# Patient Record
Sex: Male | Born: 1965 | Race: White | Hispanic: No | Marital: Married | Smoking: Current every day smoker
Health system: Southern US, Community
[De-identification: ages and names within clinical notes are randomized; demographics above are authoritative.]

## PROBLEM LIST (undated history)

## (undated) DIAGNOSIS — I10 Essential (primary) hypertension: Secondary | ICD-10-CM

## (undated) DIAGNOSIS — F32A Depression, unspecified: Secondary | ICD-10-CM

## (undated) DIAGNOSIS — I4891 Unspecified atrial fibrillation: Secondary | ICD-10-CM

---

## 2011-02-09 ENCOUNTER — Emergency Department: Payer: Self-pay | Admitting: *Deleted

## 2019-09-13 ENCOUNTER — Emergency Department: Payer: Self-pay

## 2019-09-13 ENCOUNTER — Other Ambulatory Visit: Payer: Self-pay

## 2019-09-13 ENCOUNTER — Emergency Department
Admission: EM | Admit: 2019-09-13 | Discharge: 2019-09-14 | Disposition: A | Discharge status: Other Acute Inpatient Hospital | Payer: Self-pay | Attending: Emergency Medicine | Admitting: Emergency Medicine

## 2019-09-13 DIAGNOSIS — Z79899 Other long term (current) drug therapy: Secondary | ICD-10-CM | POA: Insufficient documentation

## 2019-09-13 DIAGNOSIS — F1410 Cocaine abuse, uncomplicated: Secondary | ICD-10-CM

## 2019-09-13 DIAGNOSIS — R079 Chest pain, unspecified: Secondary | ICD-10-CM

## 2019-09-13 DIAGNOSIS — F17210 Nicotine dependence, cigarettes, uncomplicated: Secondary | ICD-10-CM | POA: Insufficient documentation

## 2019-09-13 DIAGNOSIS — F315 Bipolar disorder, current episode depressed, severe, with psychotic features: Secondary | ICD-10-CM | POA: Insufficient documentation

## 2019-09-13 DIAGNOSIS — I10 Essential (primary) hypertension: Secondary | ICD-10-CM | POA: Insufficient documentation

## 2019-09-13 DIAGNOSIS — R45851 Suicidal ideations: Secondary | ICD-10-CM

## 2019-09-13 DIAGNOSIS — Z20822 Contact with and (suspected) exposure to covid-19: Secondary | ICD-10-CM | POA: Insufficient documentation

## 2019-09-13 DIAGNOSIS — I4891 Unspecified atrial fibrillation: Secondary | ICD-10-CM | POA: Insufficient documentation

## 2019-09-13 HISTORY — DX: Unspecified atrial fibrillation: I48.91

## 2019-09-13 HISTORY — DX: Essential (primary) hypertension: I10

## 2019-09-13 HISTORY — DX: Depression, unspecified: F32.A

## 2019-09-13 LAB — CBC WITH DIFFERENTIAL/PLATELET
Abs Immature Granulocytes: 0.03 10*3/uL (ref 0.00–0.07)
Basophils Absolute: 0 10*3/uL (ref 0.0–0.1)
Basophils Relative: 1 %
Eosinophils Absolute: 0.1 10*3/uL (ref 0.0–0.5)
Eosinophils Relative: 2 %
HCT: 38 % — ABNORMAL LOW (ref 39.0–52.0)
Hemoglobin: 13.7 g/dL (ref 13.0–17.0)
Immature Granulocytes: 1 %
Lymphocytes Relative: 23 %
Lymphs Abs: 1.4 10*3/uL (ref 0.7–4.0)
MCH: 32.4 pg (ref 26.0–34.0)
MCHC: 36.1 g/dL — ABNORMAL HIGH (ref 30.0–36.0)
MCV: 89.8 fL (ref 80.0–100.0)
Monocytes Absolute: 0.4 10*3/uL (ref 0.1–1.0)
Monocytes Relative: 6 %
Neutro Abs: 4.1 10*3/uL (ref 1.7–7.7)
Neutrophils Relative %: 67 %
Platelets: 184 10*3/uL (ref 150–400)
RBC: 4.23 MIL/uL (ref 4.22–5.81)
RDW: 13.3 % (ref 11.5–15.5)
WBC: 6.1 10*3/uL (ref 4.0–10.5)
nRBC: 0 % (ref 0.0–0.2)

## 2019-09-13 LAB — URINE DRUG SCREEN, QUALITATIVE (ARMC ONLY)
Amphetamines, Ur Screen: NOT DETECTED
Barbiturates, Ur Screen: NOT DETECTED
Benzodiazepine, Ur Scrn: NOT DETECTED
Cannabinoid 50 Ng, Ur ~~LOC~~: NOT DETECTED
Cocaine Metabolite,Ur ~~LOC~~: POSITIVE — AB
MDMA (Ecstasy)Ur Screen: NOT DETECTED
Methadone Scn, Ur: NOT DETECTED
Opiate, Ur Screen: NOT DETECTED
Phencyclidine (PCP) Ur S: NOT DETECTED
Tricyclic, Ur Screen: NOT DETECTED

## 2019-09-13 LAB — BASIC METABOLIC PANEL
Anion gap: 10 (ref 5–15)
BUN: 6 mg/dL (ref 6–20)
CO2: 23 mmol/L (ref 22–32)
Calcium: 8.7 mg/dL — ABNORMAL LOW (ref 8.9–10.3)
Chloride: 107 mmol/L (ref 98–111)
Creatinine, Ser: 0.8 mg/dL (ref 0.61–1.24)
GFR calc Af Amer: >60 mL/min (ref >60)
GFR calc non Af Amer: >60 mL/min (ref >60)
Glucose, Bld: 146 mg/dL — ABNORMAL HIGH (ref 70–99)
Potassium: 3.3 mmol/L — ABNORMAL LOW (ref 3.5–5.1)
Sodium: 140 mmol/L (ref 135–145)

## 2019-09-13 LAB — ETHANOL: Alcohol, Ethyl (B): <10 mg/dL (ref <10)

## 2019-09-13 LAB — TROPONIN I (HIGH SENSITIVITY)
Troponin I (High Sensitivity): 9 ng/L (ref <18)
Troponin I (High Sensitivity): 9 ng/L (ref <18)

## 2019-09-13 LAB — SARS CORONAVIRUS 2 BY RT PCR (HOSPITAL ORDER, PERFORMED IN ~~LOC~~ HOSPITAL LAB): SARS Coronavirus 2: NEGATIVE

## 2019-09-13 IMAGING — DX DG CHEST 1V PORT
1 series · 2 of 2 positions shown · non-contrast
Comparison: None.

CLINICAL DATA: Chest pain

EXAM:
PORTABLE CHEST 1 VIEW

[Series 1: chest ap · 0.14mm/px · 2 of 2 slices shown]
[im 1/2]
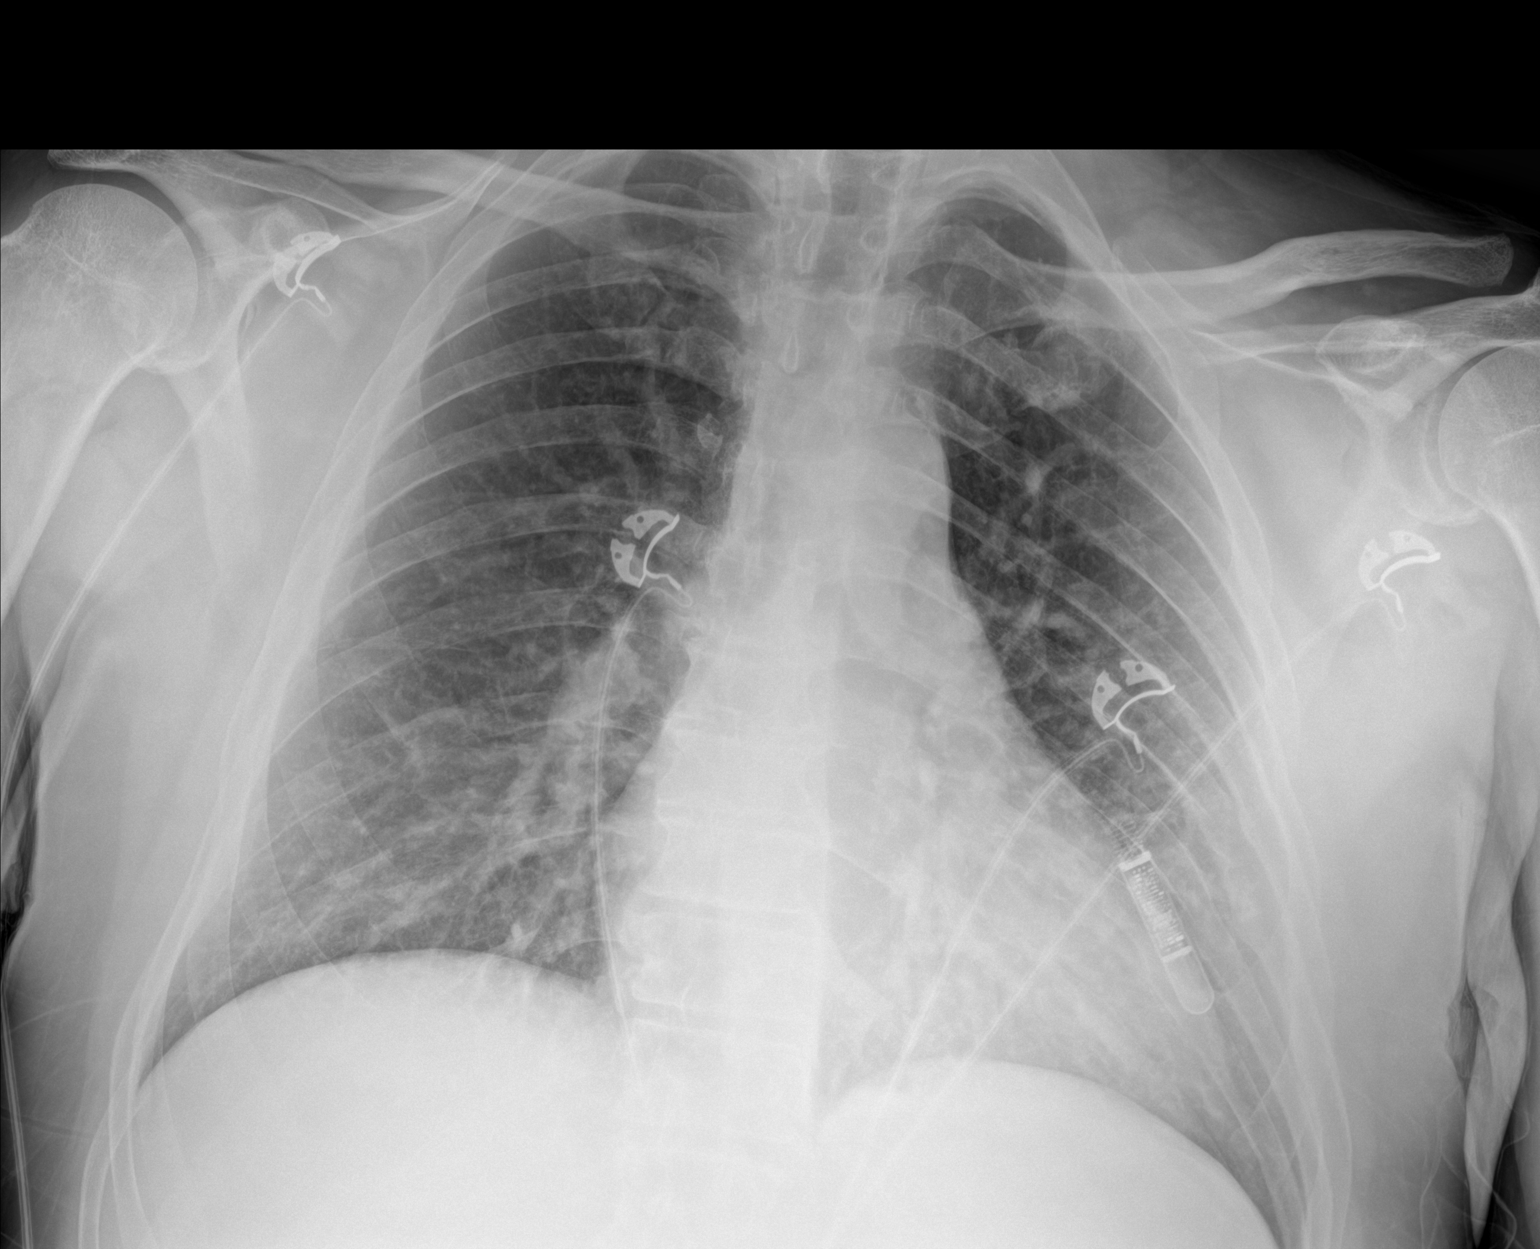
[im 2/2]
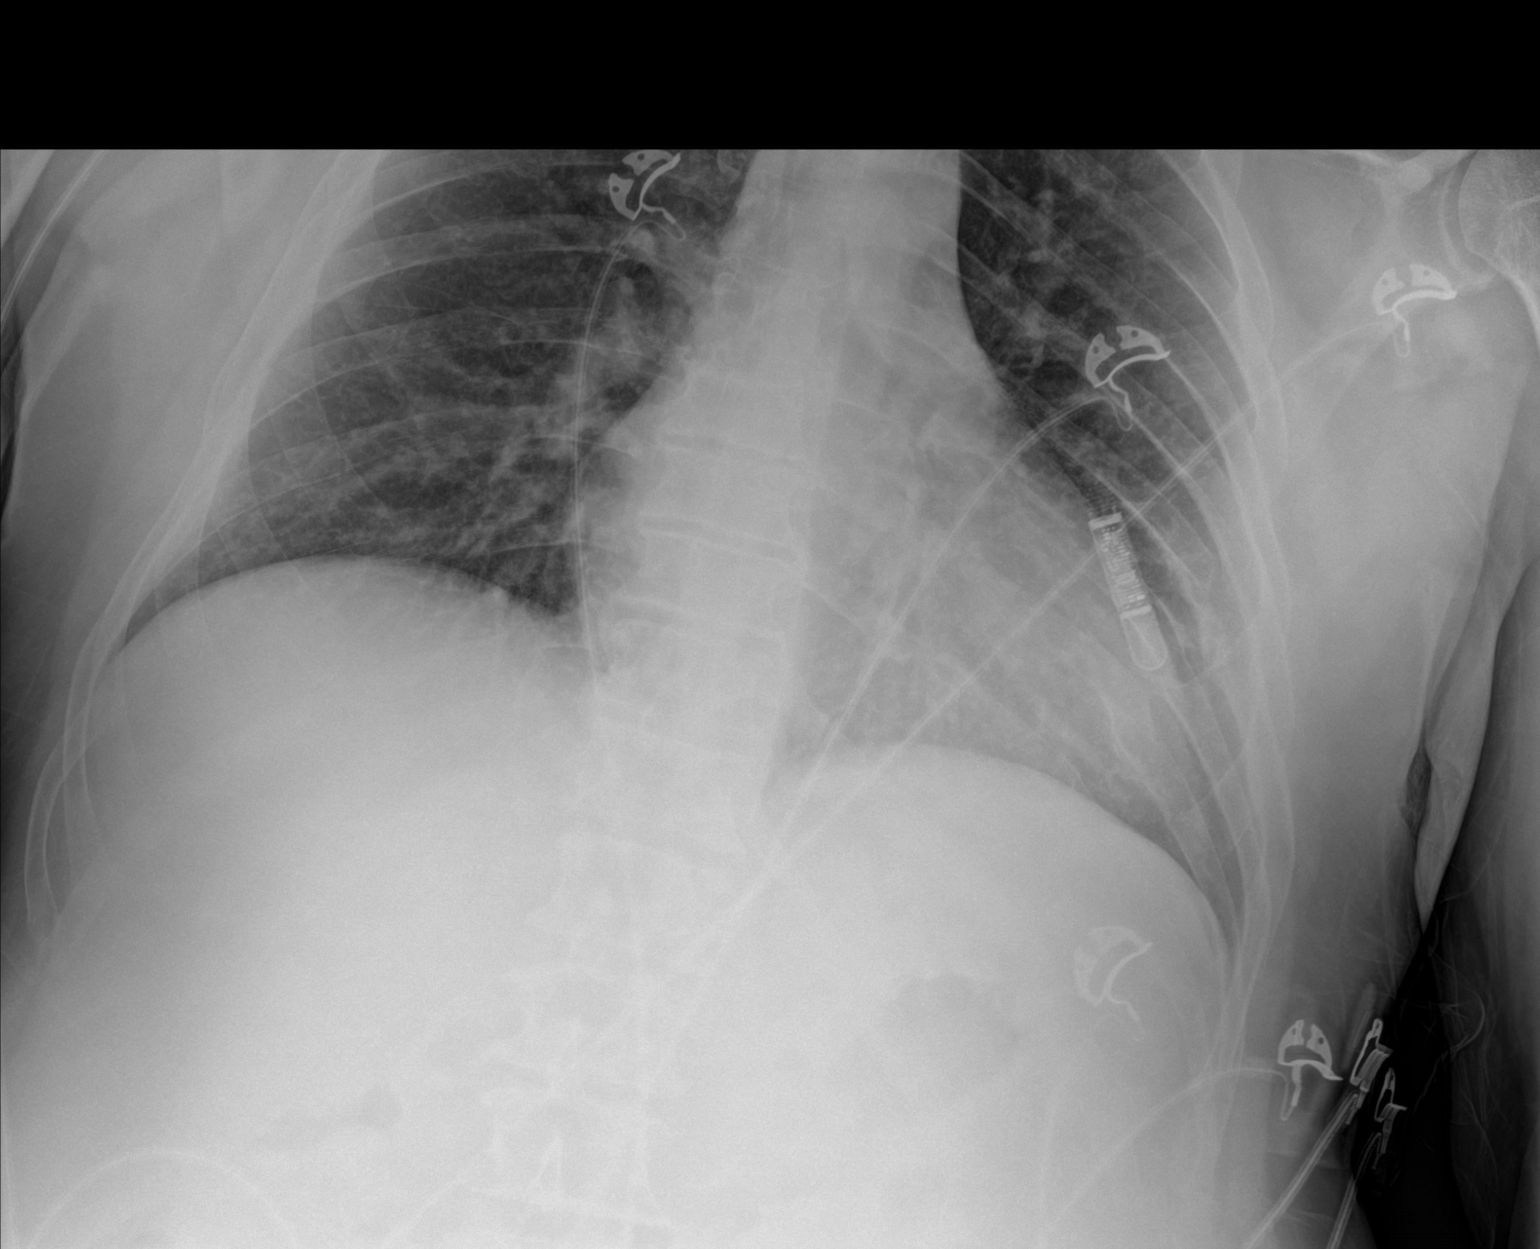

[2 of 2 positions shown; findings below may reference images not displayed]

FINDINGS: Normal cardiac silhouette. Cardiac recording device noted over the
LEFT chest wall. A streaky opacity at the RIGHT lung base could
represent bronchitis. No pneumothorax.
IMPRESSION: Streaky opacity at the RIGHT lung base could represent bronchitis.
No focal consolidation

## 2019-09-13 MED ORDER — ASPIRIN 81 MG PO CHEW
324.0000 mg | CHEWABLE_TABLET | Freq: Once | ORAL | Status: AC
  Administered 2019-09-13: 324 mg via ORAL
  Filled 2019-09-13: qty 4

## 2019-09-13 MED ORDER — RISPERIDONE 1 MG PO TABS
4.0000 mg | ORAL_TABLET | Freq: Every day | ORAL | Status: DC
  Administered 2019-09-13: 4 mg via ORAL
  Filled 2019-09-13: qty 4

## 2019-09-13 MED ORDER — BENZTROPINE MESYLATE 0.5 MG PO TABS
0.5000 mg | ORAL_TABLET | Freq: Two times a day (BID) | ORAL | Status: DC
  Administered 2019-09-13: 0.5 mg via ORAL
  Filled 2019-09-13 (×2): qty 1

## 2019-09-13 MED ORDER — DULOXETINE HCL 30 MG PO CPEP
30.0000 mg | ORAL_CAPSULE | Freq: Every day | ORAL | Status: DC
  Administered 2019-09-13: 30 mg via ORAL
  Filled 2019-09-13 (×2): qty 1

## 2019-09-13 NOTE — BH Assessment (Signed)
Referral information for Psychiatric Hospitalization faxed to;   . [facility] ([phone]-or- [phone]),   . [facility] ([phone]---[phone]---[phone]),  . [facility] ([phone] or [phone])  . [facility] ([phone]),   . [facility] ([phone] -or- [phone]),   . [facility] ([phone]). 

## 2019-09-13 NOTE — ED Notes (Signed)
Ivc moved to bhu

## 2019-09-13 NOTE — ED Notes (Signed)
Snack given.

## 2019-09-13 NOTE — ED Notes (Signed)
Psychiatrist at bedside

## 2019-09-13 NOTE — Consult Note (Signed)
[facility] Face-to-Face Psychiatry Consult   Reason for Consult:  Depression, mood issues possible SI   Referring Physician:  ED MD  Patient Identification: Tyrone Wright MRN:  [id number] Principal Diagnosis:  Bipolar disorder depressive phase with psychosis  Cocaine and ETOH dependence     Diagnosis:  Active Problems:   * No active hospital problems. *  Substance use, worsening mood swings, SI with possible plans worsening depression and anxiety and inability to maintain safety   Total Time spent with patient:  45 min -one hr    Subjective:   Tyrone Wright is a 54 y.o. male patient admitted with worsening psychosis mood issues and substance dependence  Bipolar depressive phase, as well as cocaine abuse and ETOH abuse  Dependence on both)  Ran out of meds feels suicidal   HPI:  Multiple admits to [facility] last one two months ago.  Similar issues.  NO recent rehab detox or other interventions  Ran out of all meds and so feels he is worse  He says he has worsening mood swings, ups and downs, lability, speeded thoughts, lack of sleep, highs and lows,mixed with major depression symptoms.  Feels helpless and hopeless with unclear safety margin  Voices telling him to harm self.  Feels suspicious fearful and paranoid.    Not clear how much is primary vs. Effect of substances   He has generalized anxiety with excessive worry, nervousness tension frustration, somatic and autonomic symptoms, feelings of dread and doom ---and panic as well   His BAL is less than 10 he does not feel he will go into ETOH withdrawal   UDS positive for cocaine      Past Psychiatric History:  As above   Risk to Self:  says he wants to actively harm himself  Risk to Others:   none Prior Inpatient Therapy:   Prior Outpatient Therapy:   none recently     Past Medical History:  Past Medical History:  Diagnosis Date  . Atrial fibrillation (HCC)   . Depression   . Hypertension    History reviewed. No  pertinent surgical history. Family History: History reviewed. No pertinent family history. Family Psychiatric  History: none contributory at present  Social History:  Social History   Substance and Sexual Activity  Alcohol Use Yes     Social History   Substance and Sexual Activity  Drug Use Yes  . Types: Cocaine    Social History   Socioeconomic History  . Marital status: Married    Spouse name: Not on file  . Number of children: Not on file  . Years of education: Not on file  . Highest education level: Not on file  Occupational History  . Not on file  Tobacco Use  . Smoking status: Current Every Day Smoker  . Smokeless tobacco: Never Used  Substance and Sexual Activity  . Alcohol use: Yes  . Drug use: Yes    Types: Cocaine  . Sexual activity: Not on file  Other Topics Concern  . Not on file  Social History Narrative  . Not on file   Social Determinants of Health   Financial Resource Strain:   . Difficulty of Paying Living Expenses:   Food Insecurity:   . Worried About Programme researcher, broadcasting/film/video in the Last Year:   . Barista in the Last Year:   Transportation Needs:   . Freight forwarder (Medical):   Marland Kitchen Lack of Transportation (Non-Medical):   Physical  Activity:   . Days of Exercise per Week:   . Minutes of Exercise per Session:   Stress:   . Feeling of Stress :   Social Connections:   . Frequency of Communication with Friends and Family:   . Frequency of Social Gatherings with Friends and Family:   . Attends Religious Services:   . Active Member of Clubs or Organizations:   . Attends Banker Meetings:   Marland Kitchen Marital Status:    Additional Social History:  Lives alone in apartment with no activities or healthy supports no regular NA AA or related programing      Allergies:  Not on File  Labs:  Results for orders placed or performed during the hospital encounter of 09/13/19 (from the past 48 hour(s))  Troponin I (High Sensitivity)      Status: None   Collection Time: 09/13/19  5:41 AM  Result Value Ref Range   Troponin I (High Sensitivity) 9 <18 ng/L    Comment: (NOTE) Elevated high sensitivity troponin I (hsTnI) values and significant  changes across serial measurements may suggest ACS but many other  chronic and acute conditions are known to elevate hsTnI results.  Refer to the "Links" section for chest pain algorithms and additional  guidance. Performed at [facility], [address] [place], [place] [address]   Basic metabolic panel     Status: Abnormal   Collection Time: 09/13/19  5:41 AM  Result Value Ref Range   Sodium 140 135 - 145 mmol/L   Potassium 3.3 (L) 3.5 - 5.1 mmol/L   Chloride 107 98 - 111 mmol/L   CO2 23 22 - 32 mmol/L   Glucose, Bld 146 (H) 70 - 99 mg/dL    Comment: Glucose reference range applies only to samples taken after fasting for at least 8 hours.   BUN 6 6 - 20 mg/dL   Creatinine, Ser 2.11 0.61 - 1.24 mg/dL   Calcium 8.7 (L) 8.9 - 10.3 mg/dL   GFR calc non Af Amer >60 >60 mL/min   GFR calc Af Amer >60 >60 mL/min   Anion gap 10 5 - 15    Comment: Performed at [facility], [address] [place], [place] [address]  CBC with Differential/Platelet     Status: Abnormal   Collection Time: 09/13/19  5:41 AM  Result Value Ref Range   WBC 6.1 4.0 - 10.5 K/uL   RBC 4.23 4.22 - 5.81 MIL/uL   Hemoglobin 13.7 13.0 - 17.0 g/dL   HCT 80.2 (L) 39 - 52 %   MCV 89.8 80.0 - 100.0 fL   MCH 32.4 26.0 - 34.0 pg   MCHC 36.1 (H) 30.0 - 36.0 g/dL   RDW 23.3 61.2 - 24.4 %   Platelets 184 150 - 400 K/uL   nRBC 0.0 0.0 - 0.2 %   Neutrophils Relative % 67 %   Neutro Abs 4.1 1.7 - 7.7 K/uL   Lymphocytes Relative 23 %   Lymphs Abs 1.4 0.7 - 4.0 K/uL   Monocytes Relative 6 %   Monocytes Absolute 0.4 0 - 1 K/uL   Eosinophils Relative 2 %   Eosinophils Absolute 0.1 0 - 0 K/uL   Basophils Relative 1 %   Basophils Absolute 0.0 0 - 0 K/uL   Immature Granulocytes 1 %   Abs Immature  Granulocytes 0.03 0.00 - 0.07 K/uL    Comment: Performed at [facility], [address], [place], [place] [address]  Ethanol     Status: None   Collection Time: 09/13/19  5:42 AM  Result Value Ref Range   Alcohol, Ethyl (B) <10 <10 mg/dL    Comment: (NOTE) Lowest detectable limit for serum alcohol is 10 mg/dL.  For medical purposes only. Performed at [facility], [place], [place] [address]   Troponin I (High Sensitivity)     Status: None   Collection Time: 09/13/19  7:20 AM  Result Value Ref Range   Troponin I (High Sensitivity) 9 <18 ng/L    Comment: (NOTE) Elevated high sensitivity troponin I (hsTnI) values and significant  changes across serial measurements may suggest ACS but many other  chronic and acute conditions are known to elevate hsTnI results.  Refer to the "Links" section for chest pain algorithms and additional  guidance. Performed at [facility], [address] [place], [place] [address]   Urine Drug Screen, Qualitative [facility] only)     Status: Abnormal   Collection Time: 09/13/19 11:26 AM  Result Value Ref Range   Tricyclic, Ur Screen NONE DETECTED NONE DETECTED   Amphetamines, Ur Screen NONE DETECTED NONE DETECTED   MDMA (Ecstasy)Ur Screen NONE DETECTED NONE DETECTED   Cocaine Metabolite,Ur [place] POSITIVE (A) NONE DETECTED   Opiate, Ur Screen NONE DETECTED NONE DETECTED   Phencyclidine (PCP) Ur S NONE DETECTED NONE DETECTED   Cannabinoid 50 Ng, Ur [place] NONE DETECTED NONE DETECTED   Barbiturates, Ur Screen NONE DETECTED NONE DETECTED   Benzodiazepine, Ur Scrn NONE DETECTED NONE DETECTED   Methadone Scn, Ur NONE DETECTED NONE DETECTED    Comment: (NOTE) Tricyclics + metabolites, urine    Cutoff 1000 ng/mL Amphetamines + metabolites, urine  Cutoff 1000 ng/mL MDMA (Ecstasy), urine              Cutoff 500 ng/mL Cocaine Metabolite, urine          Cutoff 300 ng/mL Opiate + metabolites, urine        Cutoff 300  ng/mL Phencyclidine (PCP), urine         Cutoff 25 ng/mL Cannabinoid, urine                 Cutoff 50 ng/mL Barbiturates + metabolites, urine  Cutoff 200 ng/mL Benzodiazepine, urine              Cutoff 200 ng/mL Methadone, urine                   Cutoff 300 ng/mL  The urine drug screen provides only a preliminary, unconfirmed analytical test result and should not be used for non-medical purposes. Clinical consideration and professional judgment should be applied to any positive drug screen result due to possible interfering substances. A more specific alternate chemical method must be used in order to obtain a confirmed analytical result. Gas chromatography / mass spectrometry (GC/MS) is the preferred confirm atory method. Performed at [facility], [address] [place], [place] [address]     Current Facility-Administered Medications  Medication Dose Route Frequency Provider Last Rate Last Admin  . benztropine (COGENTIN) tablet 0.5 mg  0.5 mg Oral BID Eulas Post, MD      . DULoxetine (CYMBALTA) DR capsule 30 mg  30 mg Oral Daily Eulas Post, MD      . risperiDONE (RISPERDAL) tablet 4 mg  4 mg Oral QHS Eulas Post, MD       No current outpatient medications on file.    Musculoskeletal: Strength &  Muscle Tone: normal  Gait & Station: normal  Patient leans: n/a   Psychiatric Specialty Exam: Physical Exam  Review of Systems  Blood pressure 114/70, pulse (!) 51, temperature 97.9 F (36.6 C), temperature source Oral, resp. rate 15, height 5' 9.5" (1.765 m), weight 86.2 kg, SpO2 96 %.Body mass index is 27.66 kg/m.  MENTAL Status  Alert cooperative oriented to person place date and time  Sensorium not clouded or fluctuant Concentration and attention normal Looks forlorn unkept haggard Speech -low tone volume  Fluency okay No frank movement problems shakes or tremors Thought process --no LOA or FOI or related issues Thought content --says he hears  voices telling him to harm self Fund of knowledge and intelligence --below average Judgement insight reliability all poor  Proverbs and abstraction normal Memory remote recent immediate intact through general quesitons Rapport --fair  Mood depressed Affect constricted Sleep --decreased Aims negative Cognition --lowered when on drugs       emors                                                        Treatment Plan Summary:  Caucasian male with history of dual diagnosis --on IVC now wanting to actively harm self --has been off of meds and coming down from effects of cocaine   Home meds restarted with addition of Cymbalta for depression and anxiety    Disposition:  Most likely will be admitted for further treatment but first will go to Psych EDU   Roselind Messier, MD 09/13/2019 4:05 PM

## 2019-09-13 NOTE — ED Notes (Signed)
Cords and other medical materials removed from room. Introduced self to pt. Pt given breakfast tray. Denies further needs.

## 2019-09-13 NOTE — ED Notes (Signed)
Pt now reports drinking x3 Bang energy drinks today

## 2019-09-13 NOTE — BH Assessment (Signed)
Assessment Note  Tyrone Wright is an 54 y.o. male who presents to the ER due to having thoughts of ending his life. Patient states he has the plan of walking in front of a moving train to kill his self. He reports of having increase feelings of worthlessness, helplessness, worthlessness and hopelessness. He recently relapsed on cocaine, which increase his symptoms of depression. He has had inpatient treatment in the past due to similar presentation.  During the interview, he was calm, cooperative and pleasant. He was able to provide appropriate answers to the questions. He denies HI and AV/H. He denies involvement with the legal system and no history of violence or aggression.  Diagnosis: Bipolar  Past Medical History:  Past Medical History:  Diagnosis Date  . Atrial fibrillation (HCC)   . Depression   . Hypertension     History reviewed. No pertinent surgical history.  Family History: History reviewed. No pertinent family history.  Social History:  reports that he has been smoking. He has never used smokeless tobacco. He reports current alcohol use. He reports current drug use. Drug: Cocaine.  Additional Social History:  Alcohol / Drug Use Pain Medications: See PTA Prescriptions: See PTA Over the Counter: See PTA History of alcohol / drug use?: Yes Longest period of sobriety (when/how long): Unable to quantify Negative Consequences of Use: Personal relationships, Financial Substance #1 Name of Substance 1: Alcohol Substance #2 Name of Substance 2: Cocaine  CIWA: CIWA-Ar BP: 114/70 Pulse Rate: (!) 51 COWS:    Allergies: Not on File  Home Medications: (Not in a hospital admission)   OB/GYN Status:  No LMP for male patient.  General Assessment Data Location of Assessment: [facility] ED TTS Assessment: In system Is this a Tele or Face-to-Face Assessment?: Face-to-Face Is this an Initial Assessment or a Re-assessment for this encounter?: Initial Assessment Patient Accompanied  by:: N/A Language Other than English: No Living Arrangements: Other (Comment) What gender do you identify as?: Male Date Telepsych consult ordered in [facility]: 09/13/19 Time Telepsych consult ordered in [facility]: 1814 Marital status: Single Pregnancy Status: No Living Arrangements: Alone Can pt return to current living arrangement?: No Admission Status: Involuntary Petitioner: ED Attending Is patient capable of signing voluntary admission?: No Referral Source: Self/Family/Friend Insurance type: None  Medical Screening Exam [facility] Walk-in ONLY) Medical Exam completed: Yes  Crisis Care Plan Living Arrangements: Alone Legal Guardian: Other: (Self) Name of Psychiatrist: Reports of none Name of Therapist: Reports of none  Education Status Is patient currently in school?: No Is the patient employed, unemployed or receiving disability?: Unemployed  Risk to self with the past 6 months Suicidal Ideation: Yes-Currently Present Has patient been a risk to self within the past 6 months prior to admission? : Yes Suicidal Intent: No Has patient had any suicidal intent within the past 6 months prior to admission? : No Is patient at risk for suicide?: Yes Suicidal Plan?: Yes-Currently Present Has patient had any suicidal plan within the past 6 months prior to admission? : Yes Specify Current Suicidal Plan: Walk in front of a moving train Access to Means: Yes Specify Access to Suicidal Means: Trains What has been your use of drugs/alcohol within the last 12 months?: alcohol & cocaine Previous Attempts/Gestures: No How many times?: 0 Other Self Harm Risks: Active drug use Triggers for Past Attempts: None known Intentional Self Injurious Behavior: None Family Suicide History: No Recent stressful life event(s): Other (Comment), Financial Problems Persecutory voices/beliefs?: No Depression: Yes Depression Symptoms: Tearfulness, Isolating, Fatigue,  Guilt, Loss of interest in usual pleasures, Feeling  worthless/self pity Substance abuse history and/or treatment for substance abuse?: Yes Suicide prevention information given to non-admitted patients: Not applicable  Risk to Others within the past 6 months Homicidal Ideation: No Does patient have any lifetime risk of violence toward others beyond the six months prior to admission? : No Thoughts of Harm to Others: No Current Homicidal Intent: No Current Homicidal Plan: No Access to Homicidal Means: No Identified Victim: Reports of none History of harm to others?: No Assessment of Violence: None Noted Violent Behavior Description: Reports of none Does patient have access to weapons?: No Criminal Charges Pending?: No Does patient have a court date: No Is patient on probation?: No  Psychosis Hallucinations: None noted Delusions: None noted  Mental Status Report Appearance/Hygiene: Unremarkable, In scrubs Eye Contact: Fair Motor Activity: Freedom of movement, Unremarkable Speech: Logical/coherent, Unremarkable Level of Consciousness: Alert Mood: Depressed, Anxious, Sad, Pleasant Affect: Appropriate to circumstance, Depressed Anxiety Level: Minimal Thought Processes: Coherent, Relevant Judgement: Unimpaired Orientation: Person, Place, Time, Situation, Appropriate for developmental age Obsessive Compulsive Thoughts/Behaviors: Minimal  Cognitive Functioning Concentration: Normal Memory: Recent Intact, Remote Intact Is patient IDD: No Insight: Fair Impulse Control: Fair Appetite: Good Have you had any weight changes? : No Change Sleep: No Change Total Hours of Sleep: 6 Vegetative Symptoms: None  ADLScreening [facility] Assessment Services) Patient's cognitive ability adequate to safely complete daily activities?: Yes Patient able to express need for assistance with ADLs?: Yes Independently performs ADLs?: Yes (appropriate for developmental age)  Prior Inpatient Therapy Prior Inpatient Therapy: Yes Prior Therapy Dates:  06/2018 Prior Therapy Facilty/Provider(s): [facility] Reason for Treatment: Szchoaffective Disorder & Depression  Prior Outpatient Therapy Prior Outpatient Therapy: Yes Prior Therapy Dates: 2020 Prior Therapy Facilty/Provider(s): [facility] Reason for Treatment: Szchoaffective Disorder & Depression Does patient have an ACCT team?: No Does patient have Intensive In-House Services?  : No Does patient have [facility] services? : No Does patient have P4CC services?: No  ADL Screening (condition at time of admission) Patient's cognitive ability adequate to safely complete daily activities?: Yes Is the patient deaf or have difficulty hearing?: No Does the patient have difficulty seeing, even when wearing glasses/contacts?: No Does the patient have difficulty concentrating, remembering, or making decisions?: No Patient able to express need for assistance with ADLs?: Yes Does the patient have difficulty dressing or bathing?: No Independently performs ADLs?: Yes (appropriate for developmental age) Does the patient have difficulty walking or climbing stairs?: No Weakness of Legs: None Weakness of Arms/Hands: None  Home Assistive Devices/Equipment Home Assistive Devices/Equipment: None  Therapy Consults (therapy consults require a physician order) PT Evaluation Needed: No OT Evalulation Needed: No SLP Evaluation Needed: No Abuse/Neglect Assessment (Assessment to be complete while patient is alone) Abuse/Neglect Assessment Can Be Completed: Yes Physical Abuse: Denies Verbal Abuse: Denies Sexual Abuse: Denies Exploitation of patient/patient's resources: Denies Self-Neglect: Denies Values / Beliefs Cultural Requests During Hospitalization: None Spiritual Requests During Hospitalization: None Consults Spiritual Care Consult Needed: No Transition of Care Team Consult Needed: No  Disposition:  Disposition Initial Assessment Completed for this Encounter: Yes  On Site  Evaluation by:   Reviewed with Physician:    Gunnar Fusi MS, LCAS, [facility], [place] Therapeutic Triage Specialist 09/13/2019 6:19 PM

## 2019-09-13 NOTE — ED Provider Notes (Signed)
[facility] Emergency Department Provider Note  ____________________________________________  Time seen: Approximately 6:05 AM  I have reviewed the triage vital signs and the nursing notes.   HISTORY  Chief Complaint Chest Pain and Psychiatric Evaluation   HPI Tyrone Wright is a 54 y.o. male with a history of atrial fibrillation, depression, hypertension who presents via  EMS for chest pain and suicidal ideation.  Patient reports using $40 worth of cocaine earlier today and drinking alcohol.  Also endorses drinking 3 being energy drinks today. he reports dull pressure like pain in the center of his chest which started before the cocaine but got worse afterwards.  Is associated with shortness of breath.  No pleuritic chest pain, no cough, no wheezing, no personal or family history of blood clots, no recent travel immobilization, no leg pain or swelling, no hemoptysis or exogenous hormones.  Patient is noncompliant with his medications.  Endorses suicidal thoughts for several weeks with a plan of jumping in front of a train.  Patient has tried to kill himself in the past by cutting.  Denies any prior history of heart attacks  Past Medical History:  Diagnosis Date  . Atrial fibrillation (HCC)   . Depression   . Hypertension     There are no problems to display for this patient.   History reviewed. No pertinent surgical history.  Prior to Admission medications   Medication Sig Start Date End Date Taking? Authorizing Provider  famotidine (PEPCID) 40 MG tablet Take 20 mg by mouth daily. 08/29/19 02/25/20 Yes [provider]  gabapentin (NEURONTIN) 400 MG capsule Take 800-1,200 mg by mouth See admin instructions. Take 2 capsules (800mg ) by mouth every morning, 2 capsules (800mg ) by mouth every afternoon and take 3 capsules (1200mg ) by mouth every night at bedtime 09/03/19  Yes [provider]  lisinopril (ZESTRIL) 20 MG tablet Take 20 mg by mouth  daily. 07/30/19  Yes [provider]  metoprolol tartrate (LOPRESSOR) 25 MG tablet Take 25 mg by mouth in the morning and at bedtime. 07/29/19  Yes [provider]  mirtazapine (REMERON) 15 MG tablet Take 45 mg by mouth at bedtime. 07/28/19 10/26/19 Yes [provider]  pantoprazole (PROTONIX) 40 MG tablet Take 40 mg by mouth daily. 07/28/19 01/24/20 Yes [provider]  prazosin (MINIPRESS) 2 MG capsule Take 4 mg by mouth at bedtime. 09/02/19  Yes [provider]  risperidone (RISPERDAL) 4 MG tablet Take 4 mg by mouth at bedtime. 09/02/19 10/02/19 Yes [provider]  thiamine 100 MG tablet Take 200 mg by mouth daily. 07/30/19  Yes [provider]    Allergies Patient has no allergy information on record.  History reviewed. No pertinent family history.  Social History Social History   Tobacco Use  . Smoking status: Current Every Day Smoker  . Smokeless tobacco: Never Used  Substance Use Topics  . Alcohol use: Yes  . Drug use: Yes    Types: Cocaine    Review of Systems  Constitutional: Negative for fever. Eyes: Negative for visual changes. ENT: Negative for sore throat. Neck: No neck pain  Cardiovascular: + chest pain. Respiratory: + shortness of breath. Gastrointestinal: Negative for abdominal pain, vomiting or diarrhea. Genitourinary: Negative for dysuria. Musculoskeletal: Negative for back pain. Skin: Negative for rash. Neurological: Negative for headaches, weakness or numbness. Psych: + SI. No HI  ____________________________________________   PHYSICAL EXAM:  VITAL SIGNS: ED Triage Vitals  Enc Vitals Group     BP  09/13/19 0542 125/77     Pulse Rate 09/13/19 0542 65     Resp 09/13/19 0542 16     Temp 09/13/19 0542 97.9 F (36.6 C)     Temp Source 09/13/19 0542 Oral     SpO2 09/13/19 0542 95 %     Weight 09/13/19 0537 190 lb (86.2 kg)     Height 09/13/19 0537 5' 9.5" (1.765 m)     Head Circumference --      Peak  Flow --      Pain Score 09/13/19 0543 10     Pain Loc --      Pain Edu? --      Excl. in GC? --     Constitutional: Alert and oriented. Well appearing and in no apparent distress. HEENT:      Head: Normocephalic and atraumatic.         Eyes: Conjunctivae are normal. Sclera is non-icteric.       Mouth/Throat: Mucous membranes are moist.       Neck: Supple with no signs of meningismus. Cardiovascular: Regular rate and rhythm. No murmurs, gallops, or rubs. 2+ symmetrical distal pulses are present in all extremities. No JVD. Respiratory: Normal respiratory effort. Lungs are clear to auscultation bilaterally. No wheezes, crackles, or rhonchi.  Gastrointestinal: Soft, non tender. Musculoskeletal: No edema, cyanosis, or erythema of extremities. Neurologic: Normal speech and language. Face is symmetric. Moving all extremities. No gross focal neurologic deficits are appreciated. Skin: Skin is warm, dry and intact. No rash noted. Psychiatric: Mood and affect are normal. Speech and behavior are normal.  ____________________________________________   LABS (all labs ordered are listed, but only abnormal results are displayed)  Labs Reviewed  URINE DRUG SCREEN, QUALITATIVE ([facility] ONLY) - Abnormal; Notable for the following components:      Result Value   Cocaine Metabolite,Ur [place] POSITIVE (*)    All other components within normal limits  BASIC METABOLIC PANEL - Abnormal; Notable for the following components:   Potassium 3.3 (*)    Glucose, Bld 146 (*)    Calcium 8.7 (*)    All other components within normal limits  CBC WITH DIFFERENTIAL/PLATELET - Abnormal; Notable for the following components:   HCT 38.0 (*)    MCHC 36.1 (*)    All other components within normal limits  SARS CORONAVIRUS 2 BY RT PCR (HOSPITAL ORDER, PERFORMED IN [facility])  ETHANOL  TROPONIN I (HIGH SENSITIVITY)  TROPONIN I (HIGH SENSITIVITY)   ____________________________________________  EKG  ED ECG  REPORT I, Nita Sickle, the attending physician, personally viewed and interpreted this ECG.  Normal sinus rhythm, rate of 62, normal intervals, normal axis, no ST elevations or depressions T wave inversions in 3 and aVF.  No prior for comparison. ____________________________________________  RADIOLOGY  I have personally reviewed the images performed during this visit and I agree with the Radiologist's read.   Interpretation by Radiologist:  DG Chest Portable 1 View  Result Date: 09/13/2019 CLINICAL DATA:  Chest pain EXAM: PORTABLE CHEST 1 VIEW COMPARISON:  None. FINDINGS: Normal cardiac silhouette. Cardiac recording device noted over the LEFT chest wall. A streaky opacity at the RIGHT lung base could represent bronchitis. No pneumothorax. IMPRESSION: Streaky opacity at the RIGHT lung base could represent bronchitis. No focal consolidation Electronically Signed   By: Genevive Bi M.D.   On: 09/13/2019 06:49      ____________________________________________   PROCEDURES  Procedure(s) performed:yes .1-3 Lead EKG Interpretation Performed by: Nita Sickle, MD Authorized  by: Rudene Re, MD     Interpretation: non-specific     ECG rate assessment: normal     Rhythm: sinus rhythm     Ectopy: none     Conduction: normal     Critical Care performed:  None ____________________________________________   INITIAL IMPRESSION / ASSESSMENT AND PLAN / ED COURSE   54 y.o. male with a history of atrial fibrillation, depression, hypertension who presents via  EMS for chest pain and suicidal ideation with plan. + EtOH, cocaine, and energy drinks tonight.  Patient is well-appearing in no distress with normal vital signs, strong pulses in all 4 extremities, neurologically intact.  EKG with no signs of acute ischemia or dysrhythmias.  Patient placed on telemetry for close monitoring.  Old medical records reviewed.  Patient was placed under IVC for his suicidal ideation with  plan.  Psychiatry has been consulted.   Differential diagnoses including ACS versus drug induced gastritis versus cocaine induced vasoconstriction.  Chest x-ray visualized with no signs of edema or pneumothorax or widened mediastinum, confirmed by radiology.  Less likely PE with no tachypnea, tachycardia, hypoxia or other risk factors for such. .Aortic dissection considered, however there were with no typical symptoms of chest pain radiating through to intrascapular back, no severe hypertension, symmetric bilateral radial pulses, no associated neurologic deficits, no associated abdominal or lower extremity symptoms, no marfanoid features or evidence of underlying connective tissue disorder, and no evidence of mediastinal widening.  Patient has no wheezing, moving good air, with no crackles and no signs of CHF or COPD.  Will give a full aspirin and get serial high-sensitivity troponin every 2 hours x2.  Will consult psychiatry.        _____________________________________________ Please note:  Patient was evaluated in Emergency Department today for the symptoms described in the history of present illness. Patient was evaluated in the context of the global COVID-19 pandemic, which necessitated consideration that the patient might be at risk for infection with the SARS-CoV-2 virus that causes COVID-19. Institutional protocols and algorithms that pertain to the evaluation of patients at risk for COVID-19 are in a state of rapid change based on information released by regulatory bodies including the CDC and federal and state organizations. These policies and algorithms were followed during the patient's care in the ED.  Some ED evaluations and interventions may be delayed as a result of limited staffing during the pandemic.   [place] Controlled Substance Database was reviewed by me. ____________________________________________   FINAL CLINICAL IMPRESSION(S) / ED DIAGNOSES   Final diagnoses:  Chest pain,  unspecified type  Cocaine abuse ([place])  Suicidal ideation      NEW MEDICATIONS STARTED DURING THIS VISIT:  ED Discharge Orders    None       Note:  This document was prepared using Dragon voice recognition software and may include unintentional dictation errors.    Alfred Levins, [place], MD 09/13/19 [phone]

## 2019-09-13 NOTE — ED Triage Notes (Signed)
Pt comes to ED via [facility] due to chest pain and Suicidal Ideations. Pt has plan of getting hit by train. [place]. Pt reports cocaine use tonight, stating $40 worth and that he smokes it. Chest pain occurred before cocaine use. Pt arrives A&O x 4.

## 2019-09-13 NOTE — ED Notes (Signed)
Pt dressed out in blue scrubs. Pt belongings: ball cap, jeans, belt, t-shirt, sneakers, socks, boxers, wallet, lg flip phone, yellow cigarette holder with cigarettes.

## 2019-09-13 NOTE — ED Notes (Signed)
Pt given ginger ale. Reports still feeling depressed. Does not want to do anything and refuses to shower.

## 2019-09-13 NOTE — ED Notes (Signed)
Pt given lunch tray.

## 2019-09-14 MED ORDER — GABAPENTIN 300 MG PO CAPS
800.0000 mg | ORAL_CAPSULE | Freq: Two times a day (BID) | ORAL | Status: DC
  Administered 2019-09-14: 800 mg via ORAL
  Filled 2019-09-14: qty 2

## 2019-09-14 MED ORDER — POTASSIUM CHLORIDE CRYS ER 20 MEQ PO TBCR
40.0000 meq | EXTENDED_RELEASE_TABLET | Freq: Once | ORAL | Status: AC
  Administered 2019-09-14: 40 meq via ORAL
  Filled 2019-09-14: qty 2

## 2019-09-14 MED ORDER — METOPROLOL TARTRATE 25 MG PO TABS: 25.0000 mg | ORAL_TABLET | Freq: Two times a day (BID) | ORAL | Status: DC

## 2019-09-14 MED ORDER — GABAPENTIN 300 MG PO CAPS: 1200.0000 mg | ORAL_CAPSULE | Freq: Every day | ORAL | Status: DC

## 2019-09-14 MED ORDER — DULOXETINE HCL 30 MG PO CPEP
30.0000 mg | ORAL_CAPSULE | Freq: Every day | ORAL | Status: DC
  Administered 2019-09-14: 30 mg via ORAL
  Filled 2019-09-14: qty 1

## 2019-09-14 MED ORDER — RISPERIDONE 3 MG PO TABS: 4.0000 mg | ORAL_TABLET | Freq: Every day | ORAL | Status: DC

## 2019-09-14 MED ORDER — PANTOPRAZOLE SODIUM 40 MG PO TBEC: 40.0000 mg | DELAYED_RELEASE_TABLET | Freq: Every day | ORAL | Status: DC

## 2019-09-14 MED ORDER — MIRTAZAPINE 15 MG PO TABS: 45.0000 mg | ORAL_TABLET | Freq: Every day | ORAL | Status: DC

## 2019-09-14 MED ORDER — PANTOPRAZOLE SODIUM 40 MG PO TBEC
40.0000 mg | DELAYED_RELEASE_TABLET | Freq: Every day | ORAL | Status: DC
  Administered 2019-09-14: 40 mg via ORAL
  Filled 2019-09-14: qty 1

## 2019-09-14 MED ORDER — LISINOPRIL 20 MG PO TABS
20.0000 mg | ORAL_TABLET | Freq: Every day | ORAL | Status: DC
  Administered 2019-09-14: 20 mg via ORAL
  Filled 2019-09-14: qty 1

## 2019-09-14 MED ORDER — BENZTROPINE MESYLATE 0.5 MG PO TABS
0.5000 mg | ORAL_TABLET | Freq: Two times a day (BID) | ORAL | Status: DC
  Administered 2019-09-14: 0.5 mg via ORAL
  Filled 2019-09-14 (×2): qty 1

## 2019-09-14 MED ORDER — PRAZOSIN HCL 2 MG PO CAPS
4.0000 mg | ORAL_CAPSULE | Freq: Every day | ORAL | Status: DC
  Filled 2019-09-14: qty 2

## 2019-09-14 MED ORDER — GABAPENTIN 400 MG PO CAPS: 800.0000 mg | ORAL_CAPSULE | ORAL | Status: DC

## 2019-09-14 MED ORDER — METOPROLOL TARTRATE 25 MG PO TABS
25.0000 mg | ORAL_TABLET | Freq: Two times a day (BID) | ORAL | Status: DC
  Administered 2019-09-14: 25 mg via ORAL
  Filled 2019-09-14: qty 1

## 2019-09-14 MED ORDER — THIAMINE HCL 100 MG PO TABS
200.0000 mg | ORAL_TABLET | Freq: Every day | ORAL | Status: DC
  Administered 2019-09-14: 200 mg via ORAL
  Filled 2019-09-14: qty 2

## 2019-09-14 MED ORDER — POTASSIUM CHLORIDE CRYS ER 20 MEQ PO TBCR: 40.0000 meq | EXTENDED_RELEASE_TABLET | Freq: Once | ORAL | Status: DC

## 2019-09-14 MED ORDER — FAMOTIDINE 20 MG PO TABS: 20.0000 mg | ORAL_TABLET | Freq: Every day | ORAL | Status: DC

## 2019-09-14 NOTE — ED Notes (Signed)
Called [facility] for transport to [facility]  [phone]

## 2019-09-14 NOTE — BH Assessment (Signed)
PATIENT BED AVAILABLE AFTER 9AM  Patient has been accepted to [facility].  Patient assigned to [facility] Accepting physician is Dr. Otho Perl.  Call report to [phone].  Representative was [place].   ER Staff is aware of it:  [facility] ER Secretary  Dr. Dolores Frame, ER MD  Dewayne Hatch Patient's Nurse       Address: [phone] [place] Patient must check-in at the [facility] for COVID screening

## 2019-09-14 NOTE — ED Notes (Signed)
Pt up to nurses station to throw trash away  

## 2019-09-14 NOTE — ED Provider Notes (Signed)
-----------------------------------------   3:25 AM on 09/14/2019 -----------------------------------------  Patient accepted to [facility] and should be transported later this morning.   Irean Hong, MD 09/14/19 [phone]

## 2020-04-12 ENCOUNTER — Other Ambulatory Visit: Payer: Self-pay

## 2020-04-12 ENCOUNTER — Emergency Department: Payer: Self-pay

## 2020-04-12 ENCOUNTER — Emergency Department
Admission: EM | Admit: 2020-04-12 | Discharge: 2020-04-12 | Disposition: A | Discharge status: Home / Self Care | Payer: Self-pay | Attending: Emergency Medicine | Admitting: Emergency Medicine

## 2020-04-12 ENCOUNTER — Encounter: Payer: Self-pay | Admitting: Emergency Medicine

## 2020-04-12 DIAGNOSIS — K529 Noninfective gastroenteritis and colitis, unspecified: Secondary | ICD-10-CM

## 2020-04-12 DIAGNOSIS — F17200 Nicotine dependence, unspecified, uncomplicated: Secondary | ICD-10-CM | POA: Insufficient documentation

## 2020-04-12 DIAGNOSIS — I10 Essential (primary) hypertension: Secondary | ICD-10-CM | POA: Insufficient documentation

## 2020-04-12 DIAGNOSIS — Z79899 Other long term (current) drug therapy: Secondary | ICD-10-CM | POA: Insufficient documentation

## 2020-04-12 DIAGNOSIS — K523 Indeterminate colitis: Secondary | ICD-10-CM | POA: Insufficient documentation

## 2020-04-12 LAB — URINALYSIS, COMPLETE (UACMP) WITH MICROSCOPIC
Bacteria, UA: NONE SEEN
Bilirubin Urine: NEGATIVE
Glucose, UA: NEGATIVE mg/dL
Hgb urine dipstick: NEGATIVE
Ketones, ur: NEGATIVE mg/dL
Leukocytes,Ua: NEGATIVE
Nitrite: NEGATIVE
Protein, ur: NEGATIVE mg/dL
Specific Gravity, Urine: 1.016 (ref 1.005–1.030)
Squamous Epithelial / HPF: NONE SEEN (ref 0–5)
pH: 6 (ref 5.0–8.0)

## 2020-04-12 LAB — CBC
HCT: 41.4 % (ref 39.0–52.0)
Hemoglobin: 13.8 g/dL (ref 13.0–17.0)
MCH: 31 pg (ref 26.0–34.0)
MCHC: 33.3 g/dL (ref 30.0–36.0)
MCV: 93 fL (ref 80.0–100.0)
Platelets: 238 10*3/uL (ref 150–400)
RBC: 4.45 MIL/uL (ref 4.22–5.81)
RDW: 13.2 % (ref 11.5–15.5)
WBC: 11.7 10*3/uL — ABNORMAL HIGH (ref 4.0–10.5)
nRBC: 0 % (ref 0.0–0.2)

## 2020-04-12 LAB — COMPREHENSIVE METABOLIC PANEL
ALT: 21 U/L (ref 0–44)
AST: 18 U/L (ref 15–41)
Albumin: 3.5 g/dL (ref 3.5–5.0)
Alkaline Phosphatase: 68 U/L (ref 38–126)
Anion gap: 13 (ref 5–15)
BUN: 6 mg/dL (ref 6–20)
CO2: 24 mmol/L (ref 22–32)
Calcium: 8.9 mg/dL (ref 8.9–10.3)
Chloride: 98 mmol/L (ref 98–111)
Creatinine, Ser: 0.75 mg/dL (ref 0.61–1.24)
GFR, Estimated: >60 mL/min (ref >60)
Glucose, Bld: 97 mg/dL (ref 70–99)
Potassium: 3.4 mmol/L — ABNORMAL LOW (ref 3.5–5.1)
Sodium: 135 mmol/L (ref 135–145)
Total Bilirubin: 0.5 mg/dL (ref 0.3–1.2)
Total Protein: 7.8 g/dL (ref 6.5–8.1)

## 2020-04-12 LAB — LIPASE, BLOOD: Lipase: 20 U/L (ref 11–51)

## 2020-04-12 MED ORDER — IOHEXOL 300 MG/ML  SOLN
100.0000 mL | Freq: Once | INTRAMUSCULAR | Status: AC | PRN
  Administered 2020-04-12: 100 mL via INTRAVENOUS
  Filled 2020-04-12: qty 100

## 2020-04-12 MED ORDER — SODIUM CHLORIDE 0.9 % IV BOLUS
1000.0000 mL | Freq: Once | INTRAVENOUS | Status: AC
  Administered 2020-04-12: 1000 mL via INTRAVENOUS

## 2020-04-12 MED ORDER — LOPERAMIDE HCL 2 MG PO TABS
2.0000 mg | ORAL_TABLET | Freq: Four times a day (QID) | ORAL | 0 refills | Status: AC | PRN
Start: 2020-04-12 — End: ?

## 2020-04-12 MED ORDER — METRONIDAZOLE 500 MG PO TABS
500.0000 mg | ORAL_TABLET | Freq: Once | ORAL | Status: AC
  Administered 2020-04-12: 500 mg via ORAL
  Filled 2020-04-12: qty 1

## 2020-04-12 MED ORDER — METRONIDAZOLE 500 MG PO TABS: 500.0000 mg | ORAL_TABLET | Freq: Two times a day (BID) | ORAL | 0 refills | Status: AC

## 2020-04-12 MED ORDER — DICYCLOMINE HCL 10 MG PO CAPS: 10.0000 mg | ORAL_CAPSULE | Freq: Four times a day (QID) | ORAL | 0 refills | Status: AC

## 2020-04-12 NOTE — ED Provider Notes (Signed)
  PROCEDURES  Procedure(s) performed (including Critical Care):  Ultrasound ED Peripheral IV (Provider)  Date/Time: 04/12/2020 8:38 PM Performed by: Concha Se, MD Authorized by: Concha Se, MD   Procedure details:    Indications: hydration     Skin Prep: chlorhexidine gluconate     Location:  Left anterior forearm   Angiocath:  20 G   Bedside Ultrasound Guided: Yes     Images: not archived     Patient tolerated procedure without complications: Yes     Dressing applied: Yes       ____________________________________________    Concha Se, MD 04/12/20 2148

## 2020-04-12 NOTE — ED Provider Notes (Signed)
[facility] Emergency Department Provider Note  ____________________________________________  Time seen: Approximately 10:31 PM  I have reviewed the triage vital signs and the nursing notes.   HISTORY  Chief Complaint Diarrhea    HPI Tyrone Wright is a 55 y.o. male who presents the emergency department complaining of 3 weeks of diarrhea, lower abdominal pain.  Patient states that he is having decreased appetite. Patient called EMS who transported the patient with stable vital signs. Patient has been unable to produce a stool sample here but states that he is having frequent diarrhea at home. He did have some antibiotic usage prior to the onset of diarrhea. Appears the patient was placed on amoxicillin. No blood in stool. No emesis.        Past Medical History:  Diagnosis Date  . Atrial fibrillation (HCC)   . Depression   . Hypertension     There are no problems to display for this patient.   History reviewed. No pertinent surgical history.  Prior to Admission medications   Medication Sig Start Date End Date Taking? Authorizing Provider  dicyclomine (BENTYL) 10 MG capsule Take 1 capsule (10 mg total) by mouth 4 (four) times daily for 14 days. 04/12/20 04/26/20 Yes Josep Luviano, Delorise Royals, PA-C  loperamide (IMODIUM A-D) 2 MG tablet Take 1 tablet (2 mg total) by mouth 4 (four) times daily as needed for diarrhea or loose stools. 04/12/20  Yes Miaya Lafontant, Delorise Royals, PA-C  metroNIDAZOLE (FLAGYL) 500 MG tablet Take 1 tablet (500 mg total) by mouth 2 (two) times daily. 04/12/20  Yes Jaymen Fetch, Delorise Royals, PA-C  famotidine (PEPCID) 40 MG tablet Take 20 mg by mouth daily. 08/29/19 02/25/20  [provider]  gabapentin (NEURONTIN) 400 MG capsule Take 800-1,200 mg by mouth See admin instructions. Take 2 capsules (800mg ) by mouth every morning, 2 capsules (800mg ) by mouth every afternoon and take 3 capsules (1200mg ) by mouth every night at bedtime 09/03/19   [provider]  lisinopril (ZESTRIL) 20 MG tablet Take 20 mg by mouth daily. 07/30/19   [provider]  metoprolol tartrate (LOPRESSOR) 25 MG tablet Take 25 mg by mouth in the morning and at bedtime. 07/29/19   [provider]  mirtazapine (REMERON) 15 MG tablet Take 45 mg by mouth at bedtime. 07/28/19 10/26/19  [provider]  pantoprazole (PROTONIX) 40 MG tablet Take 40 mg by mouth daily. 07/28/19 01/24/20  [provider]  prazosin (MINIPRESS) 2 MG capsule Take 4 mg by mouth at bedtime. 09/02/19   [provider]  thiamine 100 MG tablet Take 200 mg by mouth daily. 07/30/19   [provider]    Allergies Patient has no known allergies.  No family history on file.  Social History Social History   Tobacco Use  . Smoking status: Current Every Day Smoker  . Smokeless tobacco: Never Used  Substance Use Topics  . Alcohol use: Yes  . Drug use: Yes    Types: Cocaine     Review of Systems  Constitutional: No fever/chills Eyes: No visual changes. No discharge ENT: No upper respiratory complaints. Cardiovascular: no chest pain. Respiratory: no cough. No SOB. Gastrointestinal: Diffuse lower abdominal pain. No nausea, no vomiting. Positive diarrhea.  No constipation. Genitourinary: Negative for dysuria. No hematuria Musculoskeletal: Negative for musculoskeletal pain. Skin: Negative for rash, abrasions, lacerations, ecchymosis. Neurological: Negative for headaches, focal weakness or numbness.  10 System ROS otherwise negative.  ____________________________________________   PHYSICAL EXAM:  VITAL SIGNS: ED Triage  Vitals  Enc Vitals Group     BP 04/12/20 1453 134/79     Pulse Rate 04/12/20 1453 76     Resp 04/12/20 1453 18     Temp 04/12/20 1453 98.5 F (36.9 C)     Temp Source 04/12/20 1453 Oral     SpO2 04/12/20 1453 99 %     Weight 04/12/20 1454 190 lb 0.6 oz (86.2 kg)     Height 04/12/20 1454 5' 9.5" (1.765 m)     Head  Circumference --      Peak Flow --      Pain Score --      Pain Loc --      Pain Edu? --      Excl. in GC? --      Constitutional: Alert and oriented. Well appearing and in no acute distress. Eyes: Conjunctivae are normal. PERRL. EOMI. Head: Atraumatic. ENT:      Ears:       Nose: No congestion/rhinnorhea.      Mouth/Throat: Mucous membranes are moist.  Neck: No stridor.    Cardiovascular: Normal rate, regular rhythm. Normal S1 and S2.  Good peripheral circulation. Respiratory: Normal respiratory effort without tachypnea or retractions. Lungs CTAB. Good air entry to the bases with no decreased or absent breath sounds. Gastrointestinal: Bowel sounds 4 quadrants. Soft to palpation all quadrants. Mildly diffusely tender throughout the lower quadrants without point specific tenderness. No rebound tenderness.. No guarding or rigidity. No palpable masses. No distention. No CVA tenderness. Musculoskeletal: Full range of motion to all extremities. No gross deformities appreciated. Neurologic:  Normal speech and language. No gross focal neurologic deficits are appreciated.  Skin:  Skin is warm, dry and intact. No rash noted. Psychiatric: Mood and affect are normal. Speech and behavior are normal. Patient exhibits appropriate insight and judgement.   ____________________________________________   LABS (all labs ordered are listed, but only abnormal results are displayed)  Labs Reviewed  COMPREHENSIVE METABOLIC PANEL - Abnormal; Notable for the following components:      Result Value   Potassium 3.4 (*)    All other components within normal limits  CBC - Abnormal; Notable for the following components:   WBC 11.7 (*)    All other components within normal limits  URINALYSIS, COMPLETE (UACMP) WITH MICROSCOPIC - Abnormal; Notable for the following components:   Color, Urine YELLOW (*)    APPearance CLEAR (*)    All other components within normal limits  C DIFFICILE QUICK SCREEN W PCR  REFLEX  GASTROINTESTINAL PANEL BY PCR, STOOL (REPLACES STOOL CULTURE)  LIPASE, BLOOD   ____________________________________________  EKG   ____________________________________________  RADIOLOGY I personally viewed and evaluated these images as part of my medical decision making, as well as reviewing the written report by the radiologist.  ED Provider Interpretation: Concur with radiologist finding of colitis without evidence of perforation or diverticulitis.  CT ABDOMEN PELVIS W CONTRAST  Result Date: 04/12/2020 CLINICAL DATA:  Diarrhea x2 weeks. EXAM: CT ABDOMEN AND PELVIS WITH CONTRAST TECHNIQUE: Multidetector CT imaging of the abdomen and pelvis was performed using the standard protocol following bolus administration of intravenous contrast. CONTRAST:  OMNIPAQUE IOHEXOL 300 MG/ML  SOLN COMPARISON:  None. FINDINGS: Lower chest: No acute abnormality. Hepatobiliary: No focal liver abnormality is seen. No gallstones, gallbladder wall thickening, or biliary dilatation. Pancreas: Unremarkable. No pancreatic ductal dilatation or surrounding inflammatory changes. Spleen: Normal in size without focal abnormality. Adrenals/Urinary Tract: Adrenal glands are unremarkable. Kidneys are normal, without renal  calculi, focal lesion, or hydronephrosis. Bladder is unremarkable. Stomach/Bowel: There is a very small hiatal hernia. Appendix appears normal. No evidence of bowel dilatation. Mild to moderate severity diffuse colonic wall thickening is seen. Vascular/Lymphatic: Aortic atherosclerosis. No enlarged abdominal or pelvic lymph nodes. Reproductive: Prostate is unremarkable. Other: No abdominal wall hernia or abnormality. No abdominopelvic ascites. Musculoskeletal: Degenerative changes seen within the lumbar spine. IMPRESSION: 1. Mild to moderate severity diffuse infectious or inflammatory colitis. 2. Aortic atherosclerosis. Aortic Atherosclerosis (ICD10-I70.0). Electronically Signed   By: Aram Candela M.D.   On: 04/12/2020 21:02    ____________________________________________    PROCEDURES  Procedure(s) performed:    Procedures    Medications  metroNIDAZOLE (FLAGYL) tablet 500 mg (has no administration in time range)  sodium chloride 0.9 % bolus 1,000 mL (0 mLs Intravenous Stopped 04/12/20 2219)  iohexol (OMNIPAQUE) 300 MG/ML solution 100 mL (100 mLs Intravenous Contrast Given 04/12/20 2046)     ____________________________________________   INITIAL IMPRESSION / ASSESSMENT AND PLAN / ED COURSE  Pertinent labs & imaging results that were available during my care of the patient were reviewed by me and considered in my medical decision making (see chart for details).  Review of the [place] CSRS was performed in accordance of the [facility] prior to dispensing any controlled drugs.           Patient's diagnosis is consistent with colitis.  Patient presented to the emergency department 3 weeks of diffuse lower abdominal pain, diarrhea.  Patient had been on amoxicillin prior to the onset of symptoms, differential included colitis, diverticulitis, appendicitis, gastroenteritis, C. difficile.  Patient was unable to produce a stool specimen.  At this point without profuse ongoing diarrhea and with a low risk antibiotic, I feel that this is unlikely C. difficile.  Again patient has not been able to provide a specimen in his almost 9 hours here in the emergency department.  Patient did have findings consistent with colitis on CT scan.  Labs showed slight elevation in the white blood cell count but otherwise were relatively reassuring.  At this time patient will have Flagyl, loperamide and Bentyl for symptom relief.  Follow-up primary care as needed.  No additional work-up at this time. Patient is given ED precautions to return to the ED for any worsening or new symptoms.     ____________________________________________  FINAL CLINICAL IMPRESSION(S) / ED DIAGNOSES  Final diagnoses:   Colitis      NEW MEDICATIONS STARTED DURING THIS VISIT:  ED Discharge Orders         Ordered    metroNIDAZOLE (FLAGYL) 500 MG tablet  2 times daily        04/12/20 2247    dicyclomine (BENTYL) 10 MG capsule  4 times daily        04/12/20 2247    loperamide (IMODIUM A-D) 2 MG tablet  4 times daily PRN        04/12/20 2247              This chart was dictated using voice recognition software/Dragon. Despite best efforts to proofread, errors can occur which can change the meaning. Any change was purely unintentional.    Racheal Patches, PA-C 04/12/20 2249    [place], MD 04/12/20 [phone]

## 2020-04-12 NOTE — ED Triage Notes (Signed)
Pt in via EMS from home with c/o diarrhea for the last 2 weeks. Pt has not been able to eat normally and having lower abd pain. 150/83, FSBS 147, 98.6 temp, HR 80, sats 100%

## 2020-04-12 NOTE — ED Triage Notes (Signed)
See first nurse note. Patient denies any dizziness.

## 2020-04-12 NOTE — ED Notes (Signed)
Pt aware that stool sample is needed. Verbalized understanding.
